# Patient Record
Sex: Female | Born: 1960 | Race: Black or African American | Hispanic: No | Marital: Single | Smoking: Never smoker
Health system: Southern US, Community
[De-identification: ages and names within clinical notes are randomized; demographics above are authoritative.]

## PROBLEM LIST (undated history)

## (undated) DIAGNOSIS — E119 Type 2 diabetes mellitus without complications: Secondary | ICD-10-CM

## (undated) DIAGNOSIS — E7800 Pure hypercholesterolemia, unspecified: Secondary | ICD-10-CM

## (undated) DIAGNOSIS — I10 Essential (primary) hypertension: Secondary | ICD-10-CM

---

## 2011-12-27 ENCOUNTER — Emergency Department: Payer: Self-pay | Admitting: *Deleted

## 2013-04-26 ENCOUNTER — Emergency Department: Payer: Self-pay | Admitting: Emergency Medicine

## 2015-05-06 ENCOUNTER — Ambulatory Visit: Payer: Self-pay

## 2018-06-23 ENCOUNTER — Other Ambulatory Visit: Payer: Self-pay

## 2018-06-23 ENCOUNTER — Ambulatory Visit
Admission: EM | Admit: 2018-06-23 | Discharge: 2018-06-23 | Disposition: A | Discharge status: Home / Self Care | Payer: BLUE CROSS/BLUE SHIELD | Attending: Family Medicine | Admitting: Family Medicine

## 2018-06-23 ENCOUNTER — Encounter: Payer: Self-pay | Admitting: Gynecology

## 2018-06-23 DIAGNOSIS — L03811 Cellulitis of head [any part, except face]: Secondary | ICD-10-CM | POA: Diagnosis not present

## 2018-06-23 HISTORY — DX: Essential (primary) hypertension: I10

## 2018-06-23 HISTORY — DX: Pure hypercholesterolemia, unspecified: E78.00

## 2018-06-23 HISTORY — DX: Type 2 diabetes mellitus without complications: E11.9

## 2018-06-23 MED ORDER — CEPHALEXIN 500 MG PO CAPS: 500.0000 mg | ORAL_CAPSULE | Freq: Three times a day (TID) | ORAL | 0 refills | Status: DC

## 2018-06-23 NOTE — Discharge Instructions (Signed)
Warm compresses to area °

## 2018-06-23 NOTE — ED Provider Notes (Signed)
[facility]    CSN: [id number] Arrival date & time: 06/23/18  1259     History   Chief Complaint No chief complaint on file.   HPI Grace Chang is a 57 y.o. female.   57 yo female with a c/o pain behind her left ear for 1 week. States pain is worse when her glasses are touching that area. Also states she's noticed some swelling to the area as well. Denies any fevers, chills, rash, drainage, numbness/tingling. States she had surgery around that area years ago for trigeminal neuralgia.   The history is provided by the patient.    Past Medical History:  Diagnosis Date  . Diabetes mellitus without complication (HCC)   . Hypercholesteremia   . Hypertension     There are no active problems to display for this patient.   History reviewed. No pertinent surgical history.  OB History   None      Home Medications    Prior to Admission medications   Medication Sig Start Date End Date Taking? Authorizing Provider  aspirin EC 81 MG tablet Take by mouth.   Yes [provider]  cycloSPORINE (RESTASIS) 0.05 % ophthalmic emulsion Reported on 08/11/2015 06/01/15  Yes [provider]  hydrochlorothiazide (HYDRODIURIL) 12.5 MG tablet  04/12/16  Yes [provider]  ibuprofen (ADVIL,MOTRIN) 800 MG tablet Take by mouth. 01/02/16  Yes [provider]  lisinopril (PRINIVIL,ZESTRIL) 40 MG tablet Take by mouth. 03/16/18  Yes [provider]  metFORMIN (GLUCOPHAGE) 500 MG tablet Take by mouth. 01/09/15  Yes [provider]  olopatadine (PATANOL) 0.1 % ophthalmic solution  05/25/18  Yes [provider]  OXcarbazepine (TRILEPTAL) 150 MG tablet 3 PO TID 03/20/18  Yes [provider]  pravastatin (PRAVACHOL) 40 MG tablet take 1 tablet by mouth every evening 12/06/16  Yes [provider]  cephALEXin (KEFLEX) 500 MG capsule Take 1 capsule (500 mg total) by mouth 3 (three) times daily. 06/23/18   Payton Mccallumonty,  Avriana Joo, MD  diclofenac (VOLTAREN) 75 MG EC tablet TK 1 T PO BID PRN FOR 7 TO 10 DAYS 04/28/18   [provider]    Family History Family History  Problem Relation Age of Onset  . Cancer Mother        colon  . Cancer Father        liver cancer    Social History Social History   Tobacco Use  . Smoking status: Never Smoker  . Smokeless tobacco: Never Used  Substance Use Topics  . Alcohol use: Yes    Frequency: Never  . Drug use: Never     Allergies   Patient has no known allergies.   Review of Systems Review of Systems   Physical Exam Triage Vital Signs ED Triage Vitals  Enc Vitals Group     BP 06/23/18 1316 (!) 142/77     Pulse Rate 06/23/18 1316 66     Resp 06/23/18 1316 16     Temp 06/23/18 1316 98.5 F (36.9 C)     Temp Source 06/23/18 1316 Oral     SpO2 06/23/18 1316 99 %     Weight 06/23/18 1318 220 lb (99.8 kg)     Height 06/23/18 1318 5\' 3"  (1.6 m)     Head Circumference --      Peak Flow --      Pain Score 06/23/18 1318 4     Pain Loc --      Pain Edu? --  Excl. in [place]? --    No data found.  Updated Vital Signs BP (!) 142/77 (BP Location: Left Arm)   Pulse 66   Temp 98.5 F (36.9 C) (Oral)   Resp 16   Ht 5\' 3"  (1.6 m)   Wt 99.8 kg   SpO2 99%   BMI 38.97 kg/m   Visual Acuity Right Eye Distance:   Left Eye Distance:   Bilateral Distance:    Right Eye Near:   Left Eye Near:    Bilateral Near:     Physical Exam  Constitutional: She is oriented to person, place, and time. She appears well-developed and well-nourished. No distress.  HENT:  Right Ear: Tympanic membrane, external ear and ear canal normal.  Left Ear: Tympanic membrane, external ear and ear canal normal.  Neurological: She is alert and oriented to person, place, and time. She displays normal reflexes. No cranial nerve deficit or sensory deficit. She exhibits normal muscle tone. Coordination normal.  Skin: She is not diaphoretic. There is erythema.  Post  auricular scalp area with mild edema, warmth, erythema, and tenderness to palpation; no drainage or lesions  Nursing note and vitals reviewed.    UC Treatments / Results  Labs (all labs ordered are listed, but only abnormal results are displayed) Labs Reviewed - No data to display  EKG None  Radiology No results found.  Procedures Procedures (including critical care time)  Medications Ordered in UC Medications - No data to display  Initial Impression / Assessment and Plan / UC Course  I have reviewed the triage vital signs and the nursing notes.  Pertinent labs & imaging results that were available during my care of the patient were reviewed by me and considered in my medical decision making (see chart for details).      Final Clinical Impressions(s) / UC Diagnoses   Final diagnoses:  Cellulitis of occipital region of scalp     Discharge Instructions     Warm compresses to area    ED Prescriptions    Medication Sig Dispense Auth. Provider   cephALEXin (KEFLEX) 500 MG capsule Take 1 capsule (500 mg total) by mouth 3 (three) times daily. 30 capsule Payton Mccallum, MD     1. diagnosis reviewed with patient 2. rx as per orders above; reviewed possible side effects, interactions, risks and benefits  3. Recommend supportive treatment with warm compresses to area 4. Follow up with neurologist/surgeon  5. Follow-up prn  Controlled Substance Prescriptions [place] Controlled Substance Registry consulted? Not Applicable   Payton Mccallum, MD 06/23/18 1426

## 2018-06-23 NOTE — ED Triage Notes (Signed)
Per patient pain at left ear x 1 week ago. Per patient has  Nerve surgery on her left side head in 2014.

## 2020-11-05 ENCOUNTER — Other Ambulatory Visit: Payer: Self-pay | Admitting: Family Medicine

## 2020-11-05 DIAGNOSIS — Z1231 Encounter for screening mammogram for malignant neoplasm of breast: Secondary | ICD-10-CM

## 2020-11-11 ENCOUNTER — Other Ambulatory Visit: Payer: Self-pay

## 2020-11-11 ENCOUNTER — Ambulatory Visit
Admission: RE | Admit: 2020-11-11 | Discharge: 2020-11-11 | Disposition: A | Discharge status: Home / Self Care | Payer: BC Managed Care – PPO | Source: Ambulatory Visit | Attending: Family Medicine | Admitting: Family Medicine

## 2020-11-11 DIAGNOSIS — Z1231 Encounter for screening mammogram for malignant neoplasm of breast: Secondary | ICD-10-CM | POA: Insufficient documentation

## 2020-11-13 ENCOUNTER — Inpatient Hospital Stay
Admission: RE | Admit: 2020-11-13 | Discharge: 2020-11-13 | Disposition: A | Discharge status: Home / Self Care | Payer: Self-pay | Source: Ambulatory Visit | Attending: *Deleted | Admitting: *Deleted

## 2020-11-13 ENCOUNTER — Other Ambulatory Visit: Payer: Self-pay | Admitting: *Deleted

## 2020-11-13 DIAGNOSIS — Z1231 Encounter for screening mammogram for malignant neoplasm of breast: Secondary | ICD-10-CM

## 2022-02-28 ENCOUNTER — Other Ambulatory Visit: Payer: Self-pay | Admitting: Family Medicine

## 2022-02-28 DIAGNOSIS — Z1231 Encounter for screening mammogram for malignant neoplasm of breast: Secondary | ICD-10-CM

## 2022-03-01 ENCOUNTER — Ambulatory Visit
Admission: RE | Admit: 2022-03-01 | Discharge: 2022-03-01 | Disposition: A | Discharge status: Home / Self Care | Payer: BC Managed Care – PPO | Source: Ambulatory Visit | Attending: Family Medicine | Admitting: Family Medicine

## 2022-03-01 DIAGNOSIS — Z1231 Encounter for screening mammogram for malignant neoplasm of breast: Secondary | ICD-10-CM | POA: Insufficient documentation

## 2022-08-08 ENCOUNTER — Ambulatory Visit (INDEPENDENT_AMBULATORY_CARE_PROVIDER_SITE_OTHER): Payer: BC Managed Care – PPO

## 2022-08-08 ENCOUNTER — Encounter: Payer: Self-pay | Admitting: Emergency Medicine

## 2022-08-08 ENCOUNTER — Ambulatory Visit
Admission: EM | Admit: 2022-08-08 | Discharge: 2022-08-08 | Disposition: A | Discharge status: Home / Self Care | Payer: BC Managed Care – PPO | Attending: Physician Assistant | Admitting: Physician Assistant

## 2022-08-08 DIAGNOSIS — B349 Viral infection, unspecified: Secondary | ICD-10-CM | POA: Diagnosis present

## 2022-08-08 DIAGNOSIS — R051 Acute cough: Secondary | ICD-10-CM | POA: Diagnosis present

## 2022-08-08 DIAGNOSIS — R0602 Shortness of breath: Secondary | ICD-10-CM | POA: Diagnosis not present

## 2022-08-08 DIAGNOSIS — Z1152 Encounter for screening for COVID-19: Secondary | ICD-10-CM | POA: Diagnosis not present

## 2022-08-08 DIAGNOSIS — R42 Dizziness and giddiness: Secondary | ICD-10-CM

## 2022-08-08 DIAGNOSIS — I10 Essential (primary) hypertension: Secondary | ICD-10-CM

## 2022-08-08 LAB — RESP PANEL BY RT-PCR (RSV, FLU A&B, COVID)  RVPGX2
Influenza A by PCR: NEGATIVE
Influenza B by PCR: NEGATIVE
Resp Syncytial Virus by PCR: NEGATIVE
SARS Coronavirus 2 by RT PCR: NEGATIVE

## 2022-08-08 MED ORDER — PROMETHAZINE-DM 6.25-15 MG/5ML PO SYRP: 5.0000 mL | ORAL_SOLUTION | Freq: Four times a day (QID) | ORAL | 0 refills | Status: AC | PRN

## 2022-08-08 NOTE — ED Provider Notes (Signed)
[facility]    CSN: [id number] Arrival date & time: 08/08/22  1033      History   Chief Complaint Chief Complaint  Patient presents with   Shortness of Breath   Dizziness    HPI Grace Chang is a 62 y.o. female presenting for 3-day history of cough, congestion, shortness of breath, feeling dizzy and fatigued.  She also reports pain in her chest when she takes a deep breath or coughs.  She says she is mostly only short of breath when she lays down flat in her bed at night.  She says when she is up moving around her breathing is better.  Denies sore throat or painful swallowing, vomiting or diarrhea.  No known COVID or flu exposure.  She has been around her husband who has been sick with similar symptoms and was recently diagnosed with pneumonia.  Patient's history significant for diabetes, hypertension and hyperlipidemia.  HPI  Past Medical History:  Diagnosis Date   Diabetes mellitus without complication (HCC)    Hypercholesteremia    Hypertension     There are no problems to display for this patient.   History reviewed. No pertinent surgical history.  OB History   No obstetric history on file.      Home Medications    Prior to Admission medications   Medication Sig Start Date End Date Taking? Authorizing Provider  aspirin EC 81 MG tablet Take by mouth.   Yes [provider]  diclofenac (VOLTAREN) 75 MG EC tablet TK 1 T PO BID PRN FOR 7 TO 10 DAYS 04/28/18  Yes [provider]  hydrochlorothiazide (HYDRODIURIL) 12.5 MG tablet  04/12/16  Yes [provider]  lisinopril (PRINIVIL,ZESTRIL) 40 MG tablet Take by mouth. 03/16/18  Yes [provider]  olopatadine (PATANOL) 0.1 % ophthalmic solution  05/25/18  Yes [provider]  OXcarbazepine (TRILEPTAL) 150 MG tablet 3 PO TID 03/20/18  Yes [provider]  OZEMPIC, 1 MG/DOSE, 4 MG/3ML SOPN Inject into the skin. 04/03/22  Yes [provider]   pravastatin (PRAVACHOL) 40 MG tablet take 1 tablet by mouth every evening 12/06/16  Yes [provider]  promethazine-dextromethorphan (PROMETHAZINE-DM) 6.25-15 MG/5ML syrup Take 5 mLs by mouth 4 (four) times daily as needed. 08/08/22  Yes Eusebio Friendly B, PA-C  cycloSPORINE (RESTASIS) 0.05 % ophthalmic emulsion Reported on 08/11/2015 06/01/15   [provider]  ibuprofen (ADVIL,MOTRIN) 800 MG tablet Take by mouth. 01/02/16   [provider]  metFORMIN (GLUCOPHAGE) 500 MG tablet Take by mouth. 01/09/15   [provider]    Family History Family History  Problem Relation Age of Onset   Cancer Mother        colon   Cancer Father        liver cancer   Breast cancer Neg Hx     Social History Social History   Tobacco Use   Smoking status: Never   Smokeless tobacco: Never  Vaping Use   Vaping Use: Never used  Substance Use Topics   Alcohol use: Yes   Drug use: Never     Allergies   Patient has no known allergies.   Review of Systems Review of Systems  Constitutional:  Positive for fatigue. Negative for chills, diaphoresis and fever.  HENT:  Positive for congestion and rhinorrhea. Negative for ear pain, sinus pressure, sinus pain and sore throat.   Respiratory:  Positive for cough and shortness of breath.   Cardiovascular:  Positive for chest  pain.  Gastrointestinal:  Negative for abdominal pain, nausea and vomiting.  Musculoskeletal:  Negative for arthralgias and myalgias.  Skin:  Negative for rash.  Neurological:  Positive for dizziness. Negative for weakness and headaches.  Hematological:  Negative for adenopathy.     Physical Exam Triage Vital Signs ED Triage Vitals [08/08/22 1150]  Enc Vitals Group     BP (!) 177/89     Pulse Rate 63     Resp 18     Temp 98.4 F (36.9 C)     Temp Source Oral     SpO2 99 %     Weight 220 lb 0.3 oz (99.8 kg)     Height 5\' 3"  (1.6 m)     Head Circumference      Peak Flow      Pain Score 0      Pain Loc      Pain Edu?      Excl. in [place]?    No data found.  Updated Vital Signs BP (!) 170/83 (BP Location: Right Arm)   Pulse 63   Temp 98.4 F (36.9 C) (Oral)   Resp 18   Ht 5\' 3"  (1.6 m)   Wt 220 lb 0.3 oz (99.8 kg)   SpO2 99%   BMI 38.97 kg/m  \    Physical Exam Vitals and nursing note reviewed.  Constitutional:      General: She is not in acute distress.    Appearance: Normal appearance. She is well-developed. She is not ill-appearing or toxic-appearing.  HENT:     Head: Normocephalic and atraumatic.     Right Ear: Tympanic membrane, ear canal and external ear normal.     Left Ear: Tympanic membrane, ear canal and external ear normal.     Nose: Congestion present.     Mouth/Throat:     Mouth: Mucous membranes are moist.     Pharynx: Oropharynx is clear.  Eyes:     General: No scleral icterus.       Right eye: No discharge.        Left eye: No discharge.     Conjunctiva/sclera: Conjunctivae normal.  Cardiovascular:     Rate and Rhythm: Normal rate and regular rhythm.     Heart sounds: Normal heart sounds.  Pulmonary:     Effort: Pulmonary effort is normal. No respiratory distress.     Breath sounds: Normal breath sounds.  Musculoskeletal:     Cervical back: Neck supple.  Skin:    General: Skin is dry.  Neurological:     General: No focal deficit present.     Mental Status: She is alert. Mental status is at baseline.     Motor: No weakness.     Gait: Gait normal.  Psychiatric:        Mood and Affect: Mood normal.        Behavior: Behavior normal.        Thought Content: Thought content normal.      UC Treatments / Results  Labs (all labs ordered are listed, but only abnormal results are displayed) Labs Reviewed  RESP PANEL BY RT-PCR (RSV, FLU A&B, COVID)  [id number]    EKG   Radiology DG Chest 2 View  Result Date: 08/08/2022 CLINICAL DATA:  Cough and shortness of breath. EXAM: CHEST - 2 VIEW COMPARISON:  None Available. FINDINGS: The heart size  and mediastinal contours are within normal limits. Both lungs are clear. The visualized skeletal structures are unremarkable. IMPRESSION:  No active cardiopulmonary disease. Electronically Signed   By: Kennith Center M.D.   On: 08/08/2022 12:10    Procedures ED EKG  Date/Time: 08/08/2022 12:10 PM  Performed by: Shirlee Latch, PA-C Authorized by: Shirlee Latch, PA-C   Previous ECG:    Previous ECG:  Unavailable Interpretation:    Interpretation: abnormal   Rate:    ECG rate:  70   ECG rate assessment: normal   Rhythm:    Rhythm: sinus rhythm and A-V block     A-V block: 1st Degree   Ectopy:    Ectopy: none   QRS:    QRS axis:  Normal   QRS intervals:  Normal   QRS conduction: normal   ST segments:    ST segments:  Normal T waves:    T waves: normal   Comments:     Normal sinus rhythm, regular rate, first degree AV block.  (including critical care time)  Medications Ordered in UC Medications - No data to display  Initial Impression / Assessment and Plan / UC Course  I have reviewed the triage vital signs and the nursing notes.  Pertinent labs & imaging results that were available during my care of the patient were reviewed by me and considered in my medical decision making (see chart for details).    62 year old female presents for cough, congestion, shortness of breath, chest pain on breathing and dizziness x 3 days.  Has been exposed to her husband who is sick with similar symptoms and was recently diagnosed with pneumonia.  BP 177/89.  She is afebrile and overall well-appearing.  No acute distress.  Mild nasal congestion.  Throat is clear.  Chest clear auscultation heart regular rate and rhythm.  EKG performed by nursing staff shows normal sinus rhythm and regular rate with first-degree AV block.  No old EKGs to compare to.  Respiratory panel and chest x-ray obtained.  Chest x-ray within normal limits.  Respiratory panel negative.  Recheck of patient's blood  pressure is 170/83.  She says she did not take her blood pressure medication today.  She is not reporting any severe headaches or vision changes.  Suspect patient's symptoms are due to a viral illness especially given that she has been around her husband who has been ill.  Encouraged supportive care.  Sent Promethazine DM to try to help with cough as well as the dizziness.  Encouraged plenty of rest and fluids and careful position changes.  Advised that she should be feeling better in the next week or 2.  Advised her to return for any worsening of symptoms.  Advised to keep a log of blood pressures.  Follow-up with PCP.  ED precautions thoroughly discussed.   Final Clinical Impressions(s) / UC Diagnoses   Final diagnoses:  Viral illness  Acute cough  Shortness of breath  Dizziness  Essential hypertension     Discharge Instructions      -Your EKG is reassuring without any serious findings.  Your chest x-ray is normal.  Do not have pneumonia. - You are negative for COVID, flu and RSV. - Your blood pressure is a bit high.  This is likely because you are sick and cough medication can also elevate your blood pressure.  Keep an eye on it at home and make sure you take your medication when you get home. - If you have any worsening chest pain or dizziness, she should go to ER. - Your chest pain is likely related to the  coughing and viral illness. I sent something to help with the cough.  It contains a medication that can also help with dizziness.  Careful position changes and make sure to rest and hydrate well. --Most colds get better in 1 to 2 weeks but if you develop a fever or have increased shortness of breath, please return for reevaluation. - Follow-up with PCP     ED Prescriptions     Medication Sig Dispense Auth. Provider   promethazine-dextromethorphan (PROMETHAZINE-DM) 6.25-15 MG/5ML syrup Take 5 mLs by mouth 4 (four) times daily as needed. 118 mL Danton Clap, PA-C       PDMP not reviewed this encounter.   Danton Clap, PA-C 08/08/22 1303

## 2022-08-08 NOTE — ED Triage Notes (Signed)
Pt c/o shortness of breath, cough, nasal congestion and dizziness. Started about 3 days ago. Pt states she has chest pain when she states a deep breath. Pt states her husband recently has had pneumonia.

## 2022-08-08 NOTE — Discharge Instructions (Addendum)
-  Your EKG is reassuring without any serious findings.  Your chest x-ray is normal.  Do not have pneumonia. - You are negative for COVID, flu and RSV. - Your blood pressure is a bit high.  This is likely because you are sick and cough medication can also elevate your blood pressure.  Keep an eye on it at home and make sure you take your medication when you get home. - If you have any worsening chest pain or dizziness, she should go to ER. - Your chest pain is likely related to the coughing and viral illness. I sent something to help with the cough.  It contains a medication that can also help with dizziness.  Careful position changes and make sure to rest and hydrate well. --Most colds get better in 1 to 2 weeks but if you develop a fever or have increased shortness of breath, please return for reevaluation. - Follow-up with PCP

## 2022-11-16 IMAGING — MG MM DIGITAL SCREENING BILAT W/ TOMO AND CAD
8 series · 8 of 24 positions shown · non-contrast
Comparison: Previous exam(s).

ACR Breast Density Category a: The breast tissue is almost entirely
fatty.

CLINICAL DATA: Screening.

EXAM:
DIGITAL SCREENING BILATERAL MAMMOGRAM WITH TOMOSYNTHESIS AND CAD
TECHNIQUE: Bilateral screening digital craniocaudal and mediolateral oblique
mammograms were obtained. Bilateral screening digital breast
tomosynthesis was performed. The images were evaluated with
computer-aided detection.

[L MLO synth-2D]
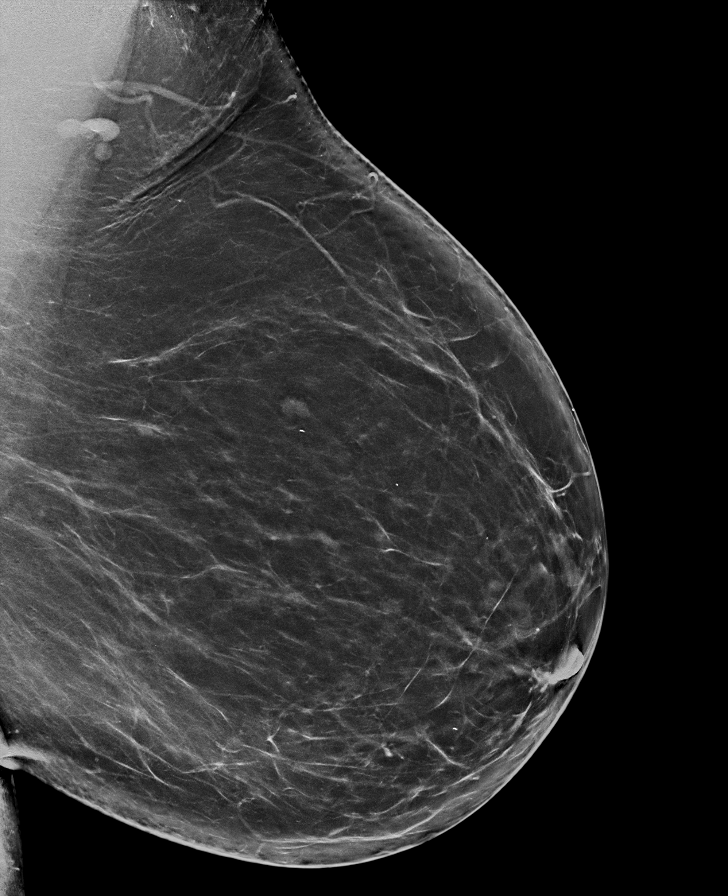

[L CC synth-2D]
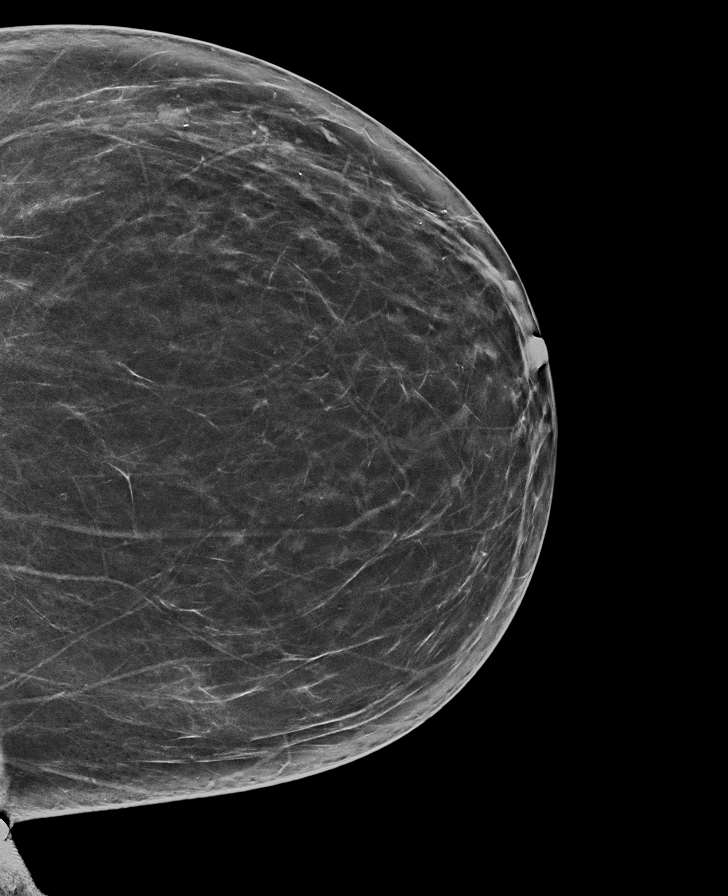

[R MLO synth-2D]
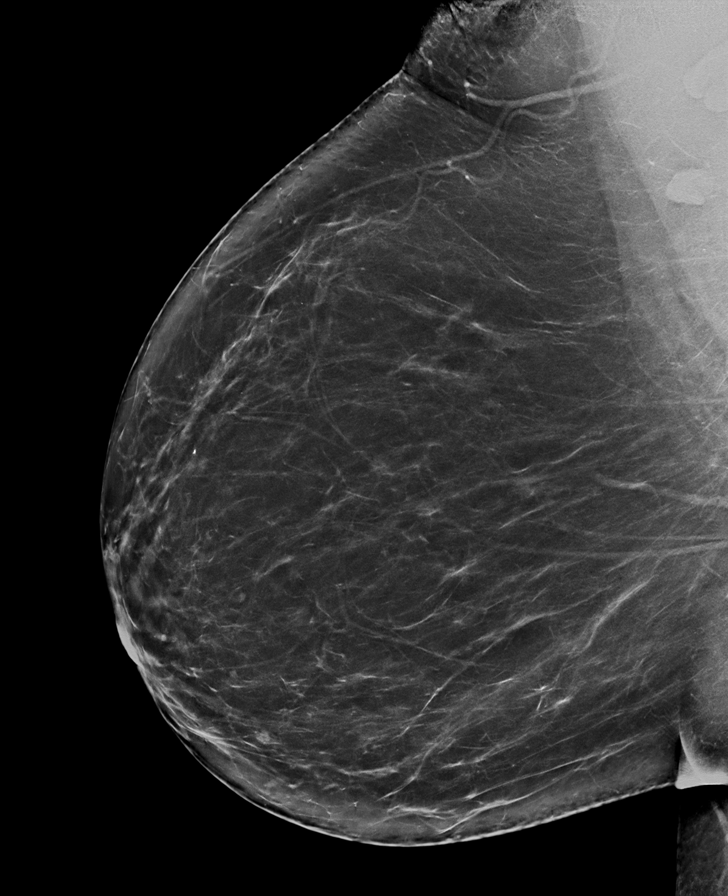

[R CC synth-2D]
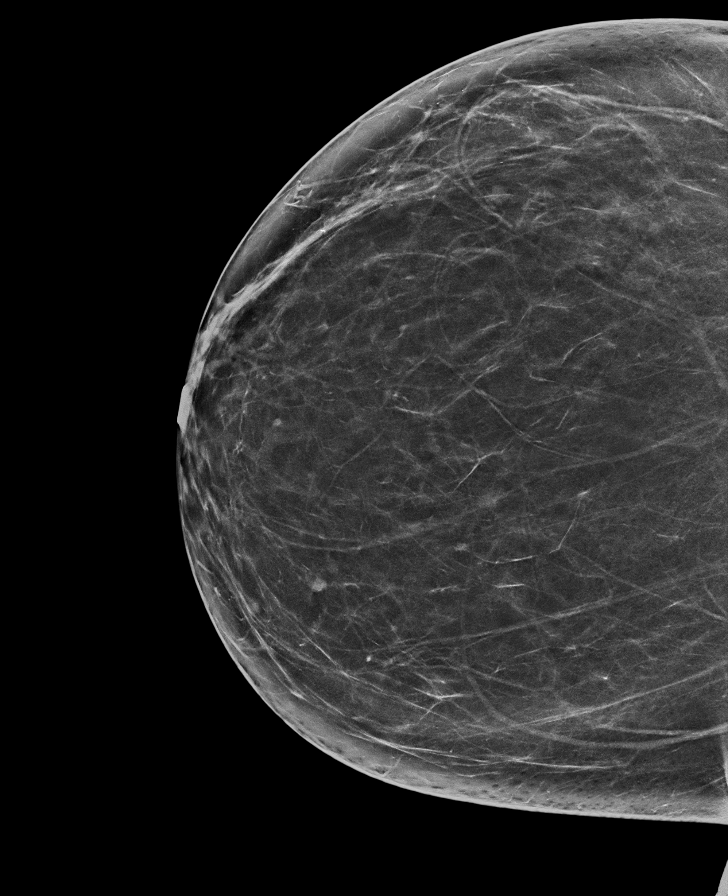

[R MLO tomo · tomo slice 47/94.0]
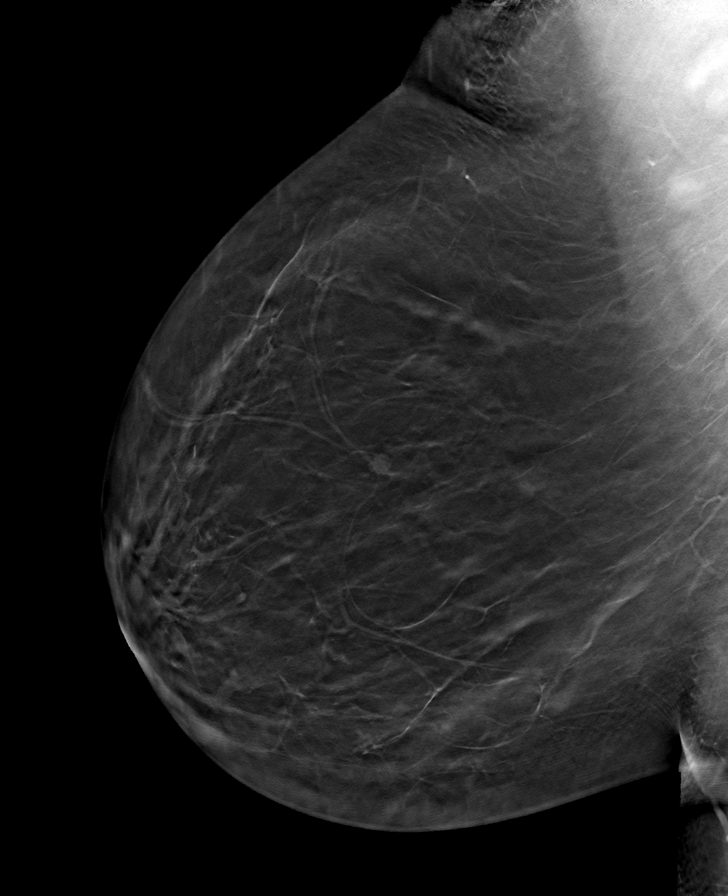

[L CC tomo · tomo slice 39/77.0]
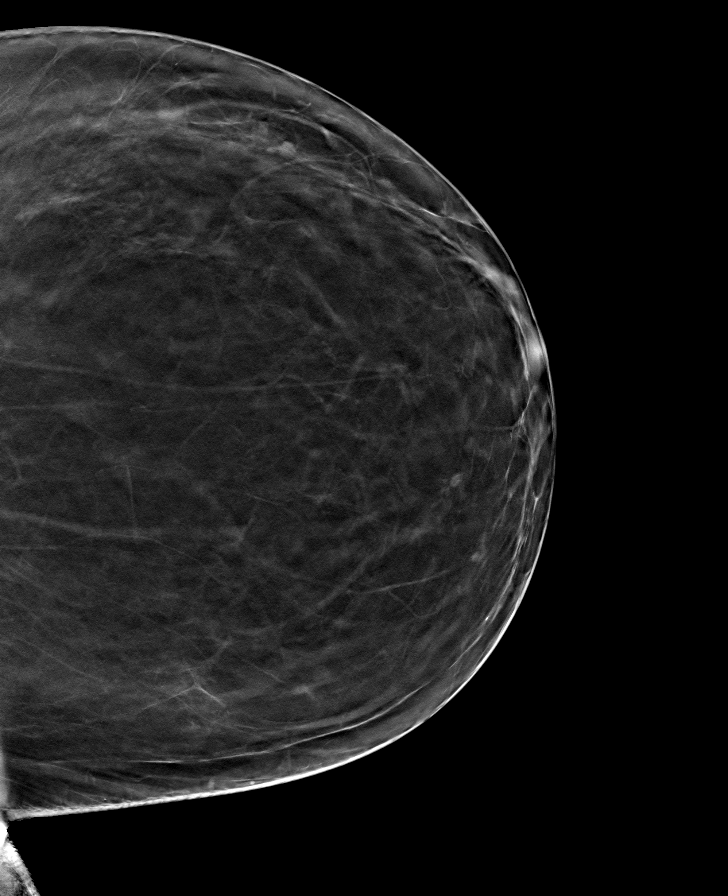

[L MLO tomo · tomo slice 45/90.0]
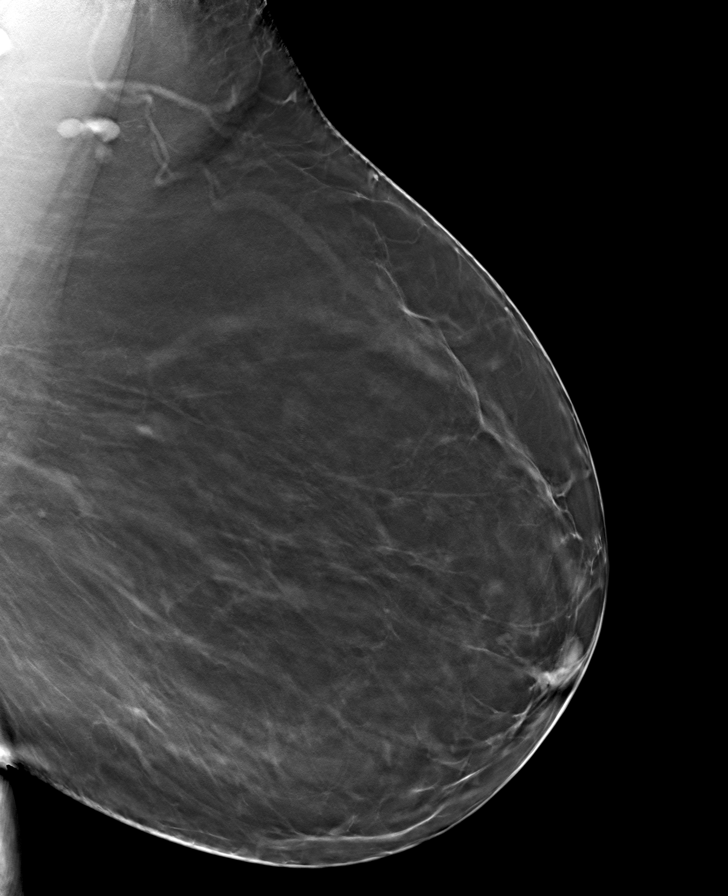

[R CC tomo · tomo slice 39/78.0]
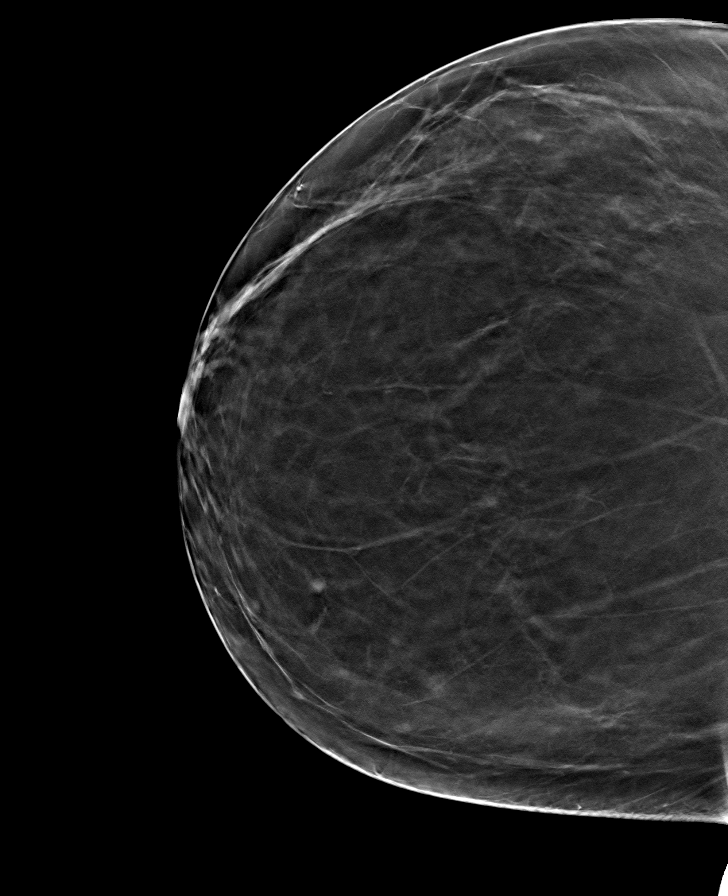

[8 of 24 positions shown; findings below may reference images not displayed]

FINDINGS: There are no findings suspicious for malignancy. The images were
evaluated with computer-aided detection.
IMPRESSION: No mammographic evidence of malignancy. A result letter of this
screening mammogram will be mailed directly to the patient.

RECOMMENDATION:
Screening mammogram in one year. (Code:JP-J-DD5)

BI-RADS CATEGORY  1: Negative.

## 2023-02-21 ENCOUNTER — Ambulatory Visit: Payer: BC Managed Care – PPO | Admitting: Family Medicine

## 2023-09-06 ENCOUNTER — Other Ambulatory Visit: Payer: Self-pay | Admitting: Family Medicine

## 2023-09-06 DIAGNOSIS — Z1231 Encounter for screening mammogram for malignant neoplasm of breast: Secondary | ICD-10-CM

## 2023-09-18 ENCOUNTER — Ambulatory Visit
Admission: RE | Admit: 2023-09-18 | Discharge: 2023-09-18 | Disposition: A | Discharge status: Home / Self Care | Payer: BC Managed Care – PPO | Source: Ambulatory Visit | Attending: Family Medicine | Admitting: Family Medicine

## 2023-09-18 DIAGNOSIS — Z1231 Encounter for screening mammogram for malignant neoplasm of breast: Secondary | ICD-10-CM | POA: Diagnosis present

## 2023-11-26 ENCOUNTER — Emergency Department
Admission: EM | Admit: 2023-11-26 | Discharge: 2023-11-26 | Disposition: A | Discharge status: Home / Self Care | Attending: Emergency Medicine | Admitting: Emergency Medicine

## 2023-11-26 ENCOUNTER — Emergency Department

## 2023-11-26 ENCOUNTER — Other Ambulatory Visit: Payer: Self-pay

## 2023-11-26 DIAGNOSIS — R519 Headache, unspecified: Secondary | ICD-10-CM | POA: Diagnosis present

## 2023-11-26 DIAGNOSIS — I10 Essential (primary) hypertension: Secondary | ICD-10-CM | POA: Insufficient documentation

## 2023-11-26 LAB — COMPREHENSIVE METABOLIC PANEL WITH GFR
ALT: 19 U/L (ref 0–44)
AST: 21 U/L (ref 15–41)
Albumin: 4.1 g/dL (ref 3.5–5.0)
Alkaline Phosphatase: 117 U/L (ref 38–126)
Anion gap: 12 (ref 5–15)
BUN: 10 mg/dL (ref 6–20)
CO2: 26 mmol/L (ref 22–32)
Calcium: 9.3 mg/dL (ref 8.9–10.3)
Chloride: 92 mmol/L — ABNORMAL LOW (ref 98–111)
Creatinine, Ser: 0.61 mg/dL (ref 0.44–1.00)
GFR, Estimated: >60 mL/min (ref >60)
Glucose, Bld: 98 mg/dL (ref 70–99)
Potassium: 4.1 mmol/L (ref 3.5–5.1)
Sodium: 130 mmol/L — ABNORMAL LOW (ref 135–145)
Total Bilirubin: 0.6 mg/dL (ref 0.0–1.2)
Total Protein: 7.7 g/dL (ref 6.5–8.1)

## 2023-11-26 LAB — DIFFERENTIAL
Abs Immature Granulocytes: 0.03 10*3/uL (ref 0.00–0.07)
Basophils Absolute: 0 10*3/uL (ref 0.0–0.1)
Basophils Relative: 0 %
Eosinophils Absolute: 0.4 10*3/uL (ref 0.0–0.5)
Eosinophils Relative: 4 %
Immature Granulocytes: 0 %
Lymphocytes Relative: 32 %
Lymphs Abs: 2.7 10*3/uL (ref 0.7–4.0)
Monocytes Absolute: 0.4 10*3/uL (ref 0.1–1.0)
Monocytes Relative: 5 %
Neutro Abs: 4.9 10*3/uL (ref 1.7–7.7)
Neutrophils Relative %: 59 %

## 2023-11-26 LAB — CBC
HCT: 41 % (ref 36.0–46.0)
Hemoglobin: 13.5 g/dL (ref 12.0–15.0)
MCH: 27.3 pg (ref 26.0–34.0)
MCHC: 32.9 g/dL (ref 30.0–36.0)
MCV: 82.8 fL (ref 80.0–100.0)
Platelets: 244 10*3/uL (ref 150–400)
RBC: 4.95 MIL/uL (ref 3.87–5.11)
RDW: 14.1 % (ref 11.5–15.5)
WBC: 8.4 10*3/uL (ref 4.0–10.5)
nRBC: 0 % (ref 0.0–0.2)

## 2023-11-26 LAB — APTT: aPTT: 30 s (ref 24–36)

## 2023-11-26 LAB — ETHANOL: Alcohol, Ethyl (B): <15 mg/dL (ref <15)

## 2023-11-26 LAB — PROTIME-INR
INR: 1 (ref 0.8–1.2)
Prothrombin Time: 13.8 s (ref 11.4–15.2)

## 2023-11-26 MED ORDER — BUTALBITAL-APAP-CAFFEINE 50-325-40 MG PO TABS
2.0000 | ORAL_TABLET | Freq: Once | ORAL | Status: AC
  Administered 2023-11-26: 2 via ORAL
  Filled 2023-11-26: qty 2

## 2023-11-26 MED ORDER — KETOROLAC TROMETHAMINE 30 MG/ML IJ SOLN
15.0000 mg | Freq: Once | INTRAMUSCULAR | Status: AC
  Administered 2023-11-26: 15 mg via INTRAVENOUS
  Filled 2023-11-26: qty 1

## 2023-11-26 MED ORDER — DROPERIDOL 2.5 MG/ML IJ SOLN
1.2500 mg | Freq: Once | INTRAMUSCULAR | Status: AC
  Administered 2023-11-26: 1.25 mg via INTRAVENOUS
  Filled 2023-11-26: qty 2

## 2023-11-26 MED ORDER — SODIUM CHLORIDE 0.9 % IV BOLUS
1000.0000 mL | Freq: Once | INTRAVENOUS | Status: AC
  Administered 2023-11-26: 1000 mL via INTRAVENOUS

## 2023-11-26 NOTE — ED Triage Notes (Signed)
 Pt to ED POV for feeling off balance while walking since 2  days ago, R arm weakness since 2 days ago, dizzy since 2 days ago. Also has HA (whole head) since this morning. States has HA every morning. States both eyes have been blurry since Wednesday.   Hx vertigo. No arm drift noted. Walks with steady gait. Speech clear. Did not take BP meds this AM.

## 2023-11-26 NOTE — ED Provider Notes (Signed)
 [facility] Provider Note    Event Date/Time   First MD Initiated Contact with Patient 11/26/23 [phone]     (approximate)   History   Dizziness, Headache, and off balance   HPI  Grace Chang is a 63 y.o. female who presents to the ED for evaluation of Dizziness, Headache, and off balance   Reviewed PCP visit from 3/7 and 5/2.  Seen for dizziness, headaches and nausea.  History of hypertension, migraine headaches, vertigo, anxiety, insomnia and obesity.  Patient presents to the ED for evaluation of 2 days of a severe headache, vertigo and a couple episodes of getting tripped up and almost falling.  Reports her sumatriptan has been effective for her headache, it goes away but then "always comes back," has not been taking any other or OTC medications.  Also reporting right arm soreness to the posterior upper arm and occasionally her right hand feels tremulous when using her phone.  No sensation changes, or discrete weakness.  Reports typical vertigo and feeling unsteady.  Tripped up over her own feet a couple times but no fall to the ground or injuries.  No fever, vision changes, presyncopal dizziness, chest discomfort.  Reports a significant amount of stress as her husband is undergoing back surgery tomorrow   Physical Exam   Triage Vital Signs: ED Triage Vitals  Encounter Vitals Group     BP 11/26/23 0831 (!) 194/72     Systolic BP Percentile --      Diastolic BP Percentile --      Pulse Rate 11/26/23 0831 71     Resp 11/26/23 0831 18     Temp 11/26/23 0831 98.6 F (37 C)     Temp Source 11/26/23 0831 Oral     SpO2 11/26/23 0831 98 %     Weight 11/26/23 0837 218 lb (98.9 kg)     Height 11/26/23 0837 5\' 3"  (1.6 m)     Head Circumference --      Peak Flow --      Pain Score 11/26/23 0835 6     Pain Loc --      Pain Education --      Exclude from Growth Chart --     Most recent vital signs: Vitals:   11/26/23 0831 11/26/23 1156  BP:  (!) 194/72 (!) 180/70  Pulse: 71 70  Resp: 18 18  Temp: 98.6 F (37 C) 98 F (36.7 C)  SpO2: 98% 98%    General: Awake, no distress.  Tearful when discussing her husband and psychosocial situation. CV:  Good peripheral perfusion.  Resp:  Normal effort.  Abd:  No distention.  MSK:  No deformity noted.  Neuro:  No focal deficits appreciated. Cranial nerves II through XII intact 5/5 strength and sensation in all 4 extremities.  No reproducible weakness Other:     ED Results / Procedures / Treatments   Labs (all labs ordered are listed, but only abnormal results are displayed) Labs Reviewed  COMPREHENSIVE METABOLIC PANEL WITH GFR - Abnormal; Notable for the following components:      Result Value   Sodium 130 (*)    Chloride 92 (*)    All other components within normal limits  PROTIME-INR  APTT  CBC  DIFFERENTIAL  ETHANOL    EKG Sinus rhythm with a rate of 71 bpm.  Normal axis.  Mild first-degree AV block with PR interval 232.  No high-grade blocks.  No clear signs of acute ischemia.  RADIOLOGY CT head interpreted by me without evidence of acute intracranial pathology  Official radiology report(s): CT HEAD WO CONTRAST Result Date: 11/26/2023 CLINICAL DATA:  Headache, sudden, severe EXAM: CT HEAD WITHOUT CONTRAST TECHNIQUE: Contiguous axial images were obtained from the base of the skull through the vertex without intravenous contrast. RADIATION DOSE REDUCTION: This exam was performed according to the departmental dose-optimization program which includes automated exposure control, adjustment of the mA and/or kV according to patient size and/or use of iterative reconstruction technique. COMPARISON:  None Available. FINDINGS: Brain: No evidence of acute infarction, hemorrhage, hydrocephalus, extra-axial collection or mass lesion/mass effect. Vascular: No hyperdense vessel or unexpected calcification. Skull: Remote left suboccipital craniectomy changes. Sinuses/Orbits: No acute  finding. Other: None. IMPRESSION: 1. No acute intracranial abnormality by noncontrast CT. 2. Remote left suboccipital craniectomy changes. Electronically Signed   By: Melven Stable.  Shick M.D.   On: 11/26/2023 09:18    PROCEDURES and INTERVENTIONS:  Procedures  Medications  sodium chloride 0.9 % bolus 1,000 mL (0 mLs Intravenous Stopped 11/26/23 1206)  butalbital-acetaminophen-caffeine (FIORICET) 50-325-40 MG per tablet 2 tablet (2 tablets Oral Given 11/26/23 0947)  ketorolac (TORADOL) 30 MG/ML injection 15 mg (15 mg Intravenous Given 11/26/23 0938)  droperidol (INAPSINE) 2.5 MG/ML injection 1.25 mg (1.25 mg Intravenous Given 11/26/23 0954)     IMPRESSION / MDM / ASSESSMENT AND PLAN / ED COURSE  I reviewed the triage vital signs and the nursing notes.  Differential diagnosis includes, but is not limited to, stroke, TIA, migrainous headache, complex migraine, peripheral vertigo,, acute stress response or anxiety  {Patient presents with symptoms of an acute illness or injury that is potentially life-threatening.  For with history of migraines presents with acute on chronic headache without evidence of additional acute pathology, suitable for outpatient management after reassuring workup.  Hypertension is noted.  No neurologic deficits that I can reproduce on exam.  Mild hyponatremia, normal CBC.  CT head is clear.  Doubt CNS pathology.  Clinical Course as of 11/26/23 1206  Sun Nov 26, 2023  1103 Reasessed, feeling better.  Discussed plan of care.  She is agreeable. [DS]  1158 Reassessed. Feeling much better and appreciative. Discussed expectant management, ED return precautions [DS]    Clinical Course User Index [DS] Arline Bennett, MD     FINAL CLINICAL IMPRESSION(S) / ED DIAGNOSES   Final diagnoses:  Bad headache     Rx / DC Orders   ED Discharge Orders     None        Note:  This document was prepared using Dragon voice recognition software and may include unintentional dictation  errors.   Arline Bennett, MD 11/26/23 [phone]

## 2023-11-26 NOTE — Discharge Instructions (Signed)
 Use Tylenol for pain and fevers.  Up to 1000 mg per dose, up to 4 times per day.  Do not take more than 4000 mg of Tylenol/acetaminophen within 24 hours..  Use naproxen/Aleve for anti-inflammatory pain relief. Use up to 500mg  every 12 hours. Do not take more frequently than this. Do not use other NSAIDs (ibuprofen, Advil) while taking this medication. It is safe to take Tylenol with this.   It is safe to take these medications alongside your sumatriptan/Imitrex as well  If your symptoms worsen despite these measures or you have any strokelike symptoms then please return to the ED

## 2024-05-20 ENCOUNTER — Other Ambulatory Visit: Payer: Self-pay | Admitting: Neurology

## 2024-05-20 DIAGNOSIS — E871 Hypo-osmolality and hyponatremia: Secondary | ICD-10-CM

## 2024-05-20 DIAGNOSIS — R519 Headache, unspecified: Secondary | ICD-10-CM

## 2024-05-23 ENCOUNTER — Ambulatory Visit
Admission: RE | Admit: 2024-05-23 | Discharge: 2024-05-23 | Disposition: A | Discharge status: Home / Self Care | Source: Ambulatory Visit | Attending: Neurology | Admitting: Neurology

## 2024-05-23 DIAGNOSIS — R519 Headache, unspecified: Secondary | ICD-10-CM | POA: Diagnosis present

## 2024-05-23 DIAGNOSIS — E871 Hypo-osmolality and hyponatremia: Secondary | ICD-10-CM | POA: Diagnosis present

## 2024-05-23 MED ORDER — GADOBUTROL 1 MMOL/ML IV SOLN
9.0000 mL | Freq: Once | INTRAVENOUS | Status: AC | PRN
  Administered 2024-05-23: 9 mL via INTRAVENOUS
